# Patient Record
Sex: Female | Born: 1940 | Race: Black or African American | Hispanic: No | Smoking: Never smoker
Health system: Southern US, Community
[De-identification: ages and names within clinical notes are randomized; demographics above are authoritative.]

## PROBLEM LIST (undated history)

## (undated) DIAGNOSIS — C50919 Malignant neoplasm of unspecified site of unspecified female breast: Secondary | ICD-10-CM

## (undated) DIAGNOSIS — I10 Essential (primary) hypertension: Secondary | ICD-10-CM

## (undated) HISTORY — PX: TOTAL ABDOMINAL HYSTERECTOMY: SHX209

## (undated) HISTORY — PX: MASTECTOMY: SHX3

## (undated) HISTORY — PX: APPENDECTOMY: SHX54

---

## 2010-01-20 DIAGNOSIS — C50919 Malignant neoplasm of unspecified site of unspecified female breast: Secondary | ICD-10-CM | POA: Insufficient documentation

## 2011-01-31 DIAGNOSIS — E782 Mixed hyperlipidemia: Secondary | ICD-10-CM | POA: Insufficient documentation

## 2014-03-31 DIAGNOSIS — I10 Essential (primary) hypertension: Secondary | ICD-10-CM | POA: Insufficient documentation

## 2017-11-12 ENCOUNTER — Encounter: Payer: Self-pay | Admitting: Emergency Medicine

## 2017-11-12 ENCOUNTER — Emergency Department
Admission: EM | Admit: 2017-11-12 | Discharge: 2017-11-12 | Disposition: A | Discharge status: Home / Self Care | Payer: Medicare Other | Attending: Emergency Medicine | Admitting: Emergency Medicine

## 2017-11-12 ENCOUNTER — Emergency Department: Payer: Medicare Other

## 2017-11-12 DIAGNOSIS — Y9389 Activity, other specified: Secondary | ICD-10-CM | POA: Diagnosis not present

## 2017-11-12 DIAGNOSIS — Z853 Personal history of malignant neoplasm of breast: Secondary | ICD-10-CM | POA: Insufficient documentation

## 2017-11-12 DIAGNOSIS — W108XXA Fall (on) (from) other stairs and steps, initial encounter: Secondary | ICD-10-CM | POA: Insufficient documentation

## 2017-11-12 DIAGNOSIS — Y9222 Religious institution as the place of occurrence of the external cause: Secondary | ICD-10-CM | POA: Diagnosis not present

## 2017-11-12 DIAGNOSIS — Z23 Encounter for immunization: Secondary | ICD-10-CM | POA: Insufficient documentation

## 2017-11-12 DIAGNOSIS — Y999 Unspecified external cause status: Secondary | ICD-10-CM | POA: Diagnosis not present

## 2017-11-12 DIAGNOSIS — S098XXA Other specified injuries of head, initial encounter: Secondary | ICD-10-CM | POA: Diagnosis present

## 2017-11-12 DIAGNOSIS — S01112A Laceration without foreign body of left eyelid and periocular area, initial encounter: Secondary | ICD-10-CM | POA: Insufficient documentation

## 2017-11-12 DIAGNOSIS — W19XXXA Unspecified fall, initial encounter: Secondary | ICD-10-CM

## 2017-11-12 DIAGNOSIS — S0990XA Unspecified injury of head, initial encounter: Secondary | ICD-10-CM

## 2017-11-12 HISTORY — DX: Malignant neoplasm of unspecified site of unspecified female breast: C50.919

## 2017-11-12 HISTORY — DX: Essential (primary) hypertension: I10

## 2017-11-12 LAB — CBC WITH DIFFERENTIAL/PLATELET
BASOS PCT: 1 %
Basophils Absolute: 0 10*3/uL (ref 0–0.1)
Eosinophils Absolute: 0.1 10*3/uL (ref 0–0.7)
Eosinophils Relative: 2 %
HEMATOCRIT: 38.5 % (ref 35.0–47.0)
Hemoglobin: 13.3 g/dL (ref 12.0–16.0)
LYMPHS ABS: 2.1 10*3/uL (ref 1.0–3.6)
LYMPHS PCT: 35 %
MCH: 32.9 pg (ref 26.0–34.0)
MCHC: 34.4 g/dL (ref 32.0–36.0)
MCV: 95.5 fL (ref 80.0–100.0)
MONO ABS: 0.5 10*3/uL (ref 0.2–0.9)
Monocytes Relative: 8 %
NEUTROS ABS: 3.3 10*3/uL (ref 1.4–6.5)
Neutrophils Relative %: 54 %
Platelets: 194 10*3/uL (ref 150–440)
RBC: 4.04 MIL/uL (ref 3.80–5.20)
RDW: 13.1 % (ref 11.5–14.5)
WBC: 6.1 10*3/uL (ref 3.6–11.0)

## 2017-11-12 LAB — URINALYSIS, COMPLETE (UACMP) WITH MICROSCOPIC
BACTERIA UA: NONE SEEN
Bilirubin Urine: NEGATIVE
GLUCOSE, UA: NEGATIVE mg/dL
HGB URINE DIPSTICK: NEGATIVE
Ketones, ur: NEGATIVE mg/dL
Leukocytes, UA: NEGATIVE
NITRITE: NEGATIVE
Protein, ur: NEGATIVE mg/dL
SPECIFIC GRAVITY, URINE: 1.013 (ref 1.005–1.030)
pH: 6 (ref 5.0–8.0)

## 2017-11-12 LAB — BASIC METABOLIC PANEL
ANION GAP: 7 (ref 5–15)
BUN: 23 mg/dL — ABNORMAL HIGH (ref 6–20)
CALCIUM: 9.6 mg/dL (ref 8.9–10.3)
CO2: 26 mmol/L (ref 22–32)
Chloride: 106 mmol/L (ref 101–111)
Creatinine, Ser: 0.96 mg/dL (ref 0.44–1.00)
GFR calc non Af Amer: 56 mL/min — ABNORMAL LOW (ref >60)
Glucose, Bld: 108 mg/dL — ABNORMAL HIGH (ref 65–99)
Potassium: 4.4 mmol/L (ref 3.5–5.1)
Sodium: 139 mmol/L (ref 135–145)

## 2017-11-12 LAB — TROPONIN I

## 2017-11-12 MED ORDER — TETANUS-DIPHTH-ACELL PERTUSSIS 5-2.5-18.5 LF-MCG/0.5 IM SUSP
0.5000 mL | Freq: Once | INTRAMUSCULAR | Status: AC
  Administered 2017-11-12: 0.5 mL via INTRAMUSCULAR
  Filled 2017-11-12: qty 0.5

## 2017-11-12 MED ORDER — ACETAMINOPHEN 500 MG PO TABS
1000.0000 mg | ORAL_TABLET | Freq: Once | ORAL | Status: AC
  Administered 2017-11-12: 1000 mg via ORAL
  Filled 2017-11-12: qty 2

## 2017-11-12 NOTE — Discharge Instructions (Addendum)

## 2017-11-12 NOTE — ED Provider Notes (Signed)
[facility] Emergency Department Provider Note  ____________________________________________  Time seen: Approximately 12:42 PM  I have reviewed the triage vital signs and the nursing notes.   HISTORY  Chief Complaint Fall   HPI Tiffany Rios is a 77 y.o. female with a history of breast cancer in remission and high blood pressure who presents for evaluation of head trauma.  Patient reports that she was walking down the steps of her church and looking for a friend when she fell and hit her head on the steps.  According to a bystander they felt the patient looked like she was going to pass out and that is what led to her fall.  Patient does not think she passed out and denies feeling dizzy before this episode.  Patient did not loose consciousness. She did sustain a small laceration on her left eyebrow area.  Her last tetanus shot is unknown.  She is complaining of mild right-sided neck pain and left-sided headache which have been present since the fall, constant and nonradiating.  She denies back pain, extremity pain, chest pain, abdominal pain.  She reports being in her usual state of health today.  She reports that she is slightly sad because today is Mother's Day and she has been thinking a lot about her husband who died in 10/08/2015.  No fever or URI symptoms, no vomiting or diarrhea.  Patient reports she only had a juice this morning for breakfast.  She has not had lunch yet.  She did take her medications this morning.  Past Medical History:  Diagnosis Date  . Breast cancer ([place])   . Hypertension     Past Surgical History:  Procedure Laterality Date  . MASTECTOMY      Prior to Admission medications   Not on File    Allergies Patient has no allergy information on record.  FH Diabetes type II Brother    High blood pressure (Hypertension) Brother    Cancer Father  Small intestine  High blood pressure (Hypertension) Father    High blood pressure  (Hypertension) Mother    Stroke Mother    Breast cancer Sister    Diabetes type II Sister    High blood pressure (Hypertension) Sister       Social History Social History   Tobacco Use  . Smoking status: Never Smoker  . Smokeless tobacco: Never Used  Substance Use Topics  . Alcohol use: Never    Frequency: Never  . Drug use: Never    Review of Systems Constitutional: Negative for fever. Eyes: Negative for visual changes. ENT: Negative for facial injury. + neck pain Cardiovascular: Negative for chest injury. Respiratory: Negative for shortness of breath. Negative for chest wall injury. Gastrointestinal: Negative for abdominal pain or injury. Genitourinary: Negative for dysuria. Musculoskeletal: Negative for back injury, negative for arm or leg pain. Skin: + eyebrow laceration Neurological: + head injury.   ____________________________________________   PHYSICAL EXAM:  VITAL SIGNS: ED Triage Vitals  Enc Vitals Group     BP 11/12/17 1203 (!) 174/81     Pulse Rate 11/12/17 1203 (!) 56     Resp 11/12/17 1203 18     Temp 11/12/17 1206 98.9 F (37.2 C)     Temp Source 11/12/17 1203 Oral     SpO2 11/12/17 1203 98 %     Weight 11/12/17 1201 139 lb (63 kg)     Height 11/12/17 1201 5' (1.524 m)     Head Circumference --  Peak Flow --      Pain Score 11/12/17 1201 7     Pain Loc --      Pain Edu? --      Excl. in [place]? --     Full spinal precautions maintained throughout the trauma exam. Constitutional: Alert and oriented. No acute distress. Does not appear intoxicated. HEENT Head: Normocephalic and atraumatic. Face: No facial bony tenderness. Stable midface Ears: No hemotympanum bilaterally. No Battle sign Eyes: No eye injury. PERRL. No raccoon eyes Nose: Nontender. No epistaxis. No rhinorrhea Mouth/Throat: Mucous membranes are moist. No oropharyngeal blood. No dental injury. Airway patent without stridor. Normal voice. Neck: no C-collar in place. No  midline c-spine tenderness. R side neck tenderness with no obvious trauma Cardiovascular: Normal rate, regular rhythm. Normal and symmetric distal pulses are present in all extremities. Pulmonary/Chest: Chest wall is stable and nontender to palpation/compression. Normal respiratory effort. Breath sounds are normal. No crepitus.  Abdominal: Soft, nontender, non distended. Musculoskeletal: Nontender with normal full range of motion in all extremities. No deformities. No thoracic or lumbar midline spinal tenderness. Pelvis is stable. Skin: Skin is warm, dry and intact. There is a linear laceration over the L eyebrow and shallow abrasions to the L side of her face Psychiatric: Speech and behavior are appropriate. Neurological: Normal speech and language. Moves all extremities to command. No gross focal neurologic deficits are appreciated.  Glascow Coma Score: 4 - Opens eyes on own 6 - Follows simple motor commands 5 - Alert and oriented GCS: 15   ____________________________________________   LABS (all labs ordered are listed, but only abnormal results are displayed)  Labs Reviewed  BASIC METABOLIC PANEL - Abnormal; Notable for the following components:      Result Value   Glucose, Bld 108 (*)    BUN 23 (*)    GFR calc non Af Amer 56 (*)    All other components within normal limits  URINALYSIS, COMPLETE (UACMP) WITH MICROSCOPIC - Abnormal; Notable for the following components:   Color, Urine YELLOW (*)    APPearance CLEAR (*)    All other components within normal limits  CBC WITH DIFFERENTIAL/PLATELET  TROPONIN I   ____________________________________________  EKG  ED ECG REPORT I, Rudene Re, the attending physician, personally viewed and interpreted this ECG.  Sinus bradycardia, rate of 56, normal intervals, normal axis, no ST elevations or depressions, T wave inversion in V3 and V4. No prior for  comparison. ____________________________________________  RADIOLOGY  I have personally reviewed the images performed during this visit and I agree with the Radiologist's read.   Interpretation by Radiologist:  Ct Head Wo Contrast  Result Date: 11/12/2017 CLINICAL DATA:  Status post fall at church with head pain. EXAM: CT HEAD WITHOUT CONTRAST CT CERVICAL SPINE WITHOUT CONTRAST TECHNIQUE: Multidetector CT imaging of the head and cervical spine was performed following the standard protocol without intravenous contrast. Multiplanar CT image reconstructions of the cervical spine were also generated. COMPARISON:  None. FINDINGS: CT HEAD FINDINGS Brain: No evidence of acute infarction, hemorrhage, hydrocephalus, extra-axial collection or mass lesion/mass effect. Vascular: No hyperdense vessel. Skull: Normal. Negative for fracture or focal lesion. Sinuses/Orbits: No acute finding. Other: Left frontal scalp swelling/hematoma is noted. CT CERVICAL SPINE FINDINGS Alignment: Normal. Skull base and vertebrae: No acute fracture. No primary bone lesion or focal pathologic process. Soft tissues and spinal canal: No prevertebral fluid or swelling. No visible canal hematoma. Disc levels: Degenerative joint changes with narrowed joint space and osteophyte formation are identified at  C4, C5 and C6. Upper chest: Negative. Other: None. IMPRESSION: No focal acute intracranial abnormality identified. Left frontal scalp swelling/hematoma. No acute fracture or dislocation of cervical spine. Degenerative joint changes of cervical spine. Electronically Signed   By: Abelardo Diesel M.D.   On: 11/12/2017 12:42   Ct Cervical Spine Wo Contrast  Result Date: 11/12/2017 CLINICAL DATA:  Status post fall at church with head pain. EXAM: CT HEAD WITHOUT CONTRAST CT CERVICAL SPINE WITHOUT CONTRAST TECHNIQUE: Multidetector CT imaging of the head and cervical spine was performed following the standard protocol without intravenous contrast.  Multiplanar CT image reconstructions of the cervical spine were also generated. COMPARISON:  None. FINDINGS: CT HEAD FINDINGS Brain: No evidence of acute infarction, hemorrhage, hydrocephalus, extra-axial collection or mass lesion/mass effect. Vascular: No hyperdense vessel. Skull: Normal. Negative for fracture or focal lesion. Sinuses/Orbits: No acute finding. Other: Left frontal scalp swelling/hematoma is noted. CT CERVICAL SPINE FINDINGS Alignment: Normal. Skull base and vertebrae: No acute fracture. No primary bone lesion or focal pathologic process. Soft tissues and spinal canal: No prevertebral fluid or swelling. No visible canal hematoma. Disc levels: Degenerative joint changes with narrowed joint space and osteophyte formation are identified at C4, C5 and C6. Upper chest: Negative. Other: None. IMPRESSION: No focal acute intracranial abnormality identified. Left frontal scalp swelling/hematoma. No acute fracture or dislocation of cervical spine. Degenerative joint changes of cervical spine. Electronically Signed   By: Abelardo Diesel M.D.   On: 11/12/2017 12:42     ____________________________________________   PROCEDURES  Procedure(s) performed: yes .Marland KitchenLaceration Repair Date/Time: 11/12/2017 2:07 PM Performed by: Rudene Re, MD Authorized by: Rudene Re, MD   Consent:    Consent obtained:  Verbal   Consent given by:  Patient   Risks discussed:  Infection, pain, retained foreign body, poor cosmetic result and poor wound healing Anesthesia (see MAR for exact dosages):    Anesthesia method:  None Laceration details:    Length (cm):  2 Repair type:    Repair type:  Simple Exploration:    Hemostasis achieved with:  Direct pressure   Wound exploration: entire depth of wound probed and visualized     Wound extent: no fascia violation noted     Contaminated: no   Treatment:    Area cleansed with:  Saline   Amount of cleaning:  Extensive   Irrigation solution:  Sterile  saline   Visualized foreign bodies/material removed: no   Skin repair:    Repair method:  Steri-Strips and tissue adhesive   Number of Steri-Strips:  4 Approximation:    Approximation:  Close Post-procedure details:    Dressing:  Sterile dressing   Patient tolerance of procedure:  Tolerated well, no immediate complications   Critical Care performed:  None ____________________________________________   INITIAL IMPRESSION / ASSESSMENT AND PLAN / ED COURSE   77 y.o. female with a history of breast cancer in remission and high blood pressure who presents for evaluation of head trauma from fall vs syncopal event.  Patient sustained a linear shallow laceration over her left eyebrow.  Tetanus shot was given.  CT head and cervical spine showing no acute findings.  Since there was concern of possible near syncopal event leading to the fall an EKG was done which shows no evidence of ischemia or dysrhythmias.  Labs are pending to rule out anemia, dehydration, electrolyte abnormalities, urinary tract infection.  Laceration will be repaired.  Patient will be monitored on telemetry for any signs of arrhythmia.    _________________________  2:26 PM on 11/12/2017 -----------------------------------------  Labs with no evidence of dehydration, anemia, electrolyte abnormalities.  UA with no evidence of urinary tract infection.  Patient was monitored on telemetry for 2.5 hours with no evidence of arrhythmias.  At this time she is stable for discharge home with close follow-up with primary care doctor.  She is going to be discharged to the care of her daughter.   As part of my medical decision making, I reviewed the following data within the [facility] notes reviewed and incorporated, Labs reviewed , EKG interpreted , Old chart reviewed, Radiograph reviewed , Notes from prior ED visits and [place] Controlled Substance Database    Pertinent labs & imaging results that were available  during my care of the patient were reviewed by me and considered in my medical decision making (see chart for details).    ____________________________________________   FINAL CLINICAL IMPRESSION(S) / ED DIAGNOSES  Final diagnoses:  Fall, initial encounter  Injury of head, initial encounter  Eyebrow laceration, left, initial encounter      NEW MEDICATIONS STARTED DURING THIS VISIT:  ED Discharge Orders    None       Note:  This document was prepared using Dragon voice recognition software and may include unintentional dictation errors.    Alfred Levins, [place], MD 11/12/17 [phone]

## 2017-11-12 NOTE — ED Triage Notes (Signed)
Pt arrived with member from church after fall that caused her to hit her left head. Pt is unsure of the events leading up to fall and per church member "it looks like she passed out, not tripped." Upon arrival to ed pt alert and oriented x4 but was unable to stand, pt had to be carried from car and placed into bed. Pt does report pain to her head and states it is a 7 on a 0-10 scale and states it feels "sore."

## 2017-11-12 NOTE — ED Notes (Signed)
Ice pack placed on patient's left eye d/t swelling.

## 2018-05-18 DIAGNOSIS — I2510 Atherosclerotic heart disease of native coronary artery without angina pectoris: Secondary | ICD-10-CM | POA: Insufficient documentation

## 2018-05-18 DIAGNOSIS — I519 Heart disease, unspecified: Secondary | ICD-10-CM | POA: Insufficient documentation

## 2019-04-27 IMAGING — CT CT CERVICAL SPINE W/O CM
4 of 7 series · 13 of 33 positions shown, 14 images · non-contrast
Comparison: None.

CLINICAL DATA: Status post fall at church with head pain.

EXAM:
CT HEAD WITHOUT CONTRAST
CT CERVICAL SPINE WITHOUT CONTRAST
TECHNIQUE: Multidetector CT imaging of the head and cervical spine was
performed following the standard protocol without intravenous
contrast. Multiplanar CT image reconstructions of the cervical spine
were also generated.

[Series 7: c spine soft · axial · 0.29mm/px · z∈[+143,+235]mm · 4 of 78 slices shown]
[im 16/78  soft-tissue]
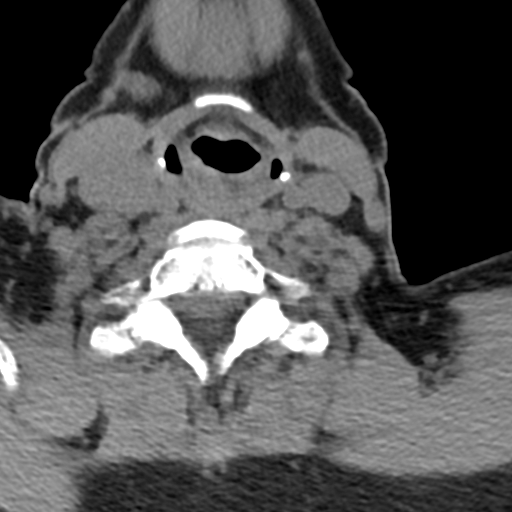
[im 31/78  soft-tissue]
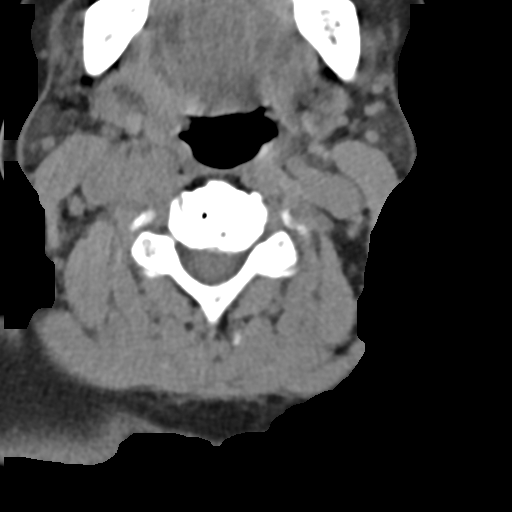
[im 47/78  soft-tissue]
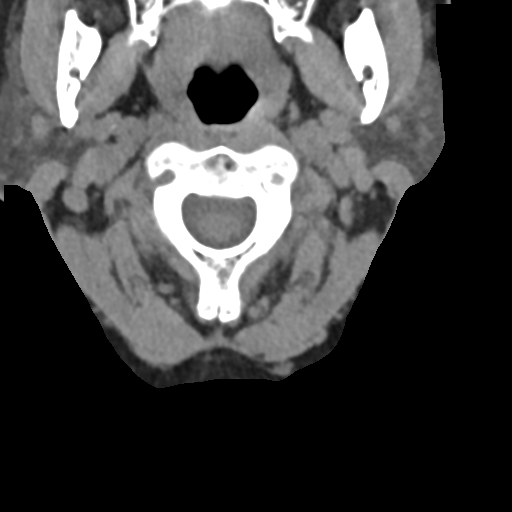
[im 62/78  soft-tissue]
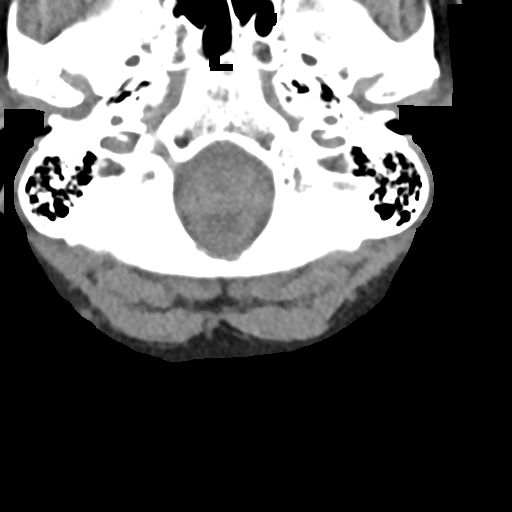

[Series 8: sagittal bone · sagittal · 0.23mm/px · 4 of 46 slices shown]
[im 10/46  bone]
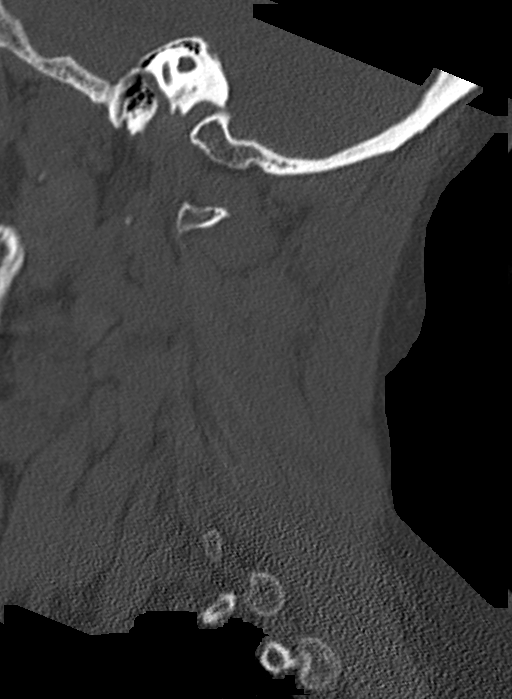
[im 19/46  bone]
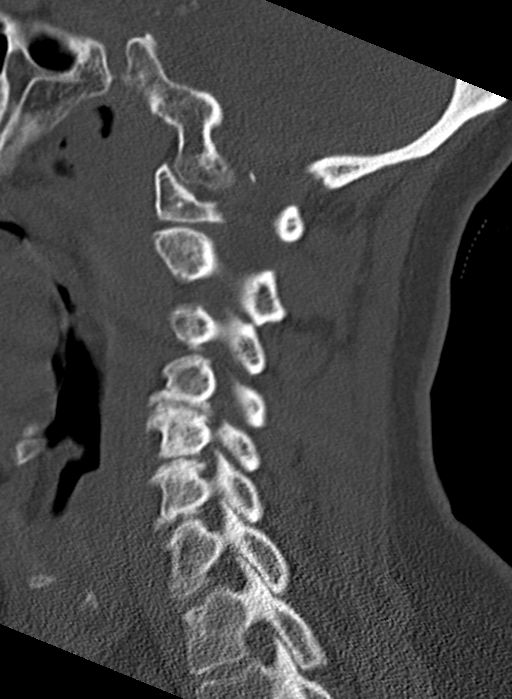
[im 28/46  bone]
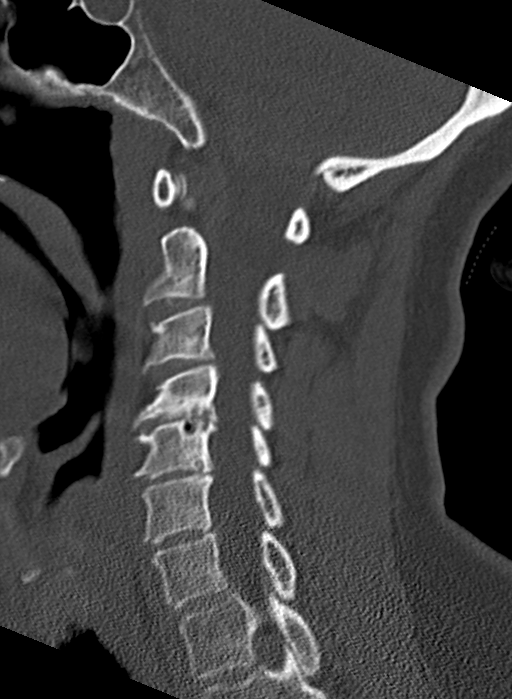
[im 37/46  bone]
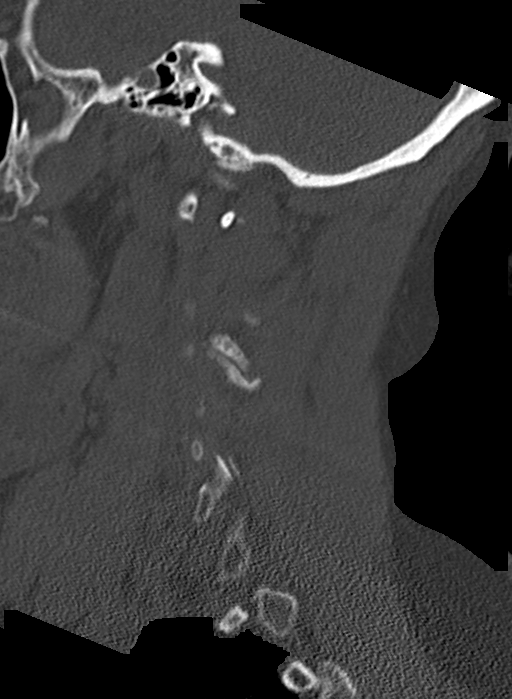

[Series 9: coronal bone · coronal · 0.22mm/px · 1 of 43 slices shown]
[im 22/43  bone]
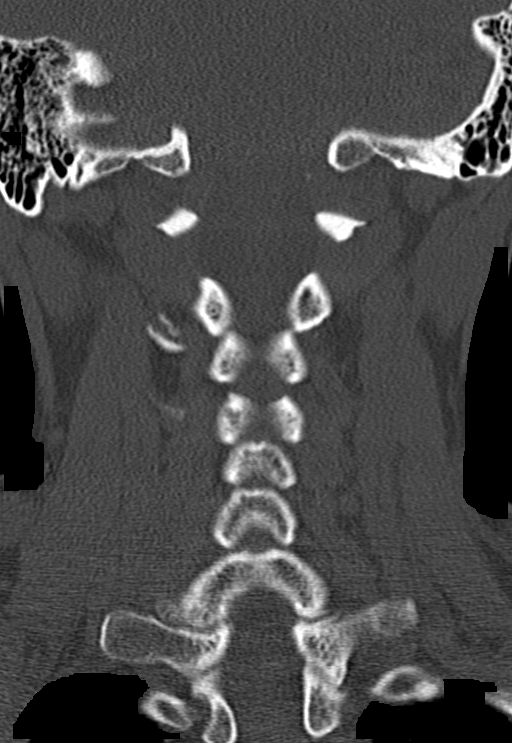

[Series 10: orthogonal bone · axial · 0.21mm/px · z∈[+119,+204]mm · 4 of 81 slices shown, 5 images]
[im 17/81  soft-tissue]
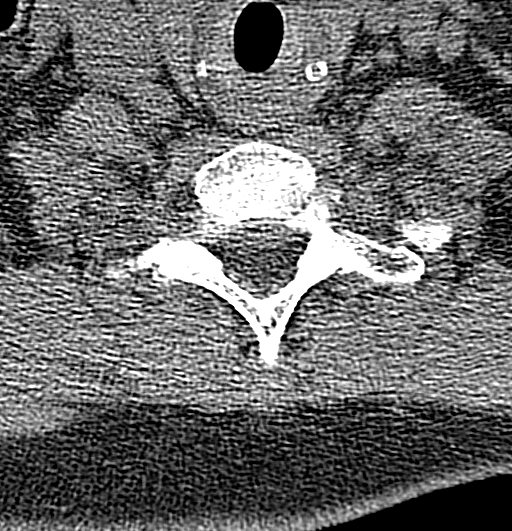
[im 17/81  bone]
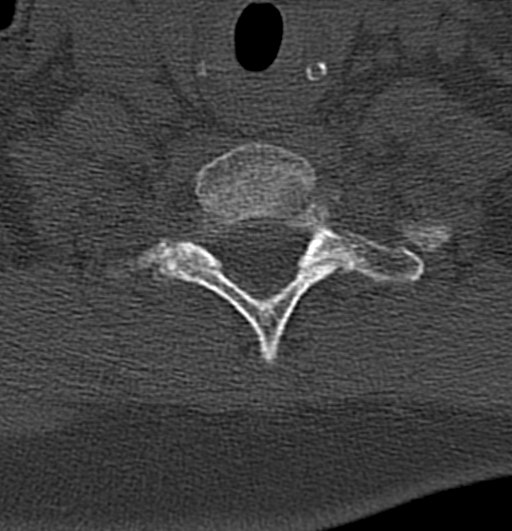
[im 33/81  bone]
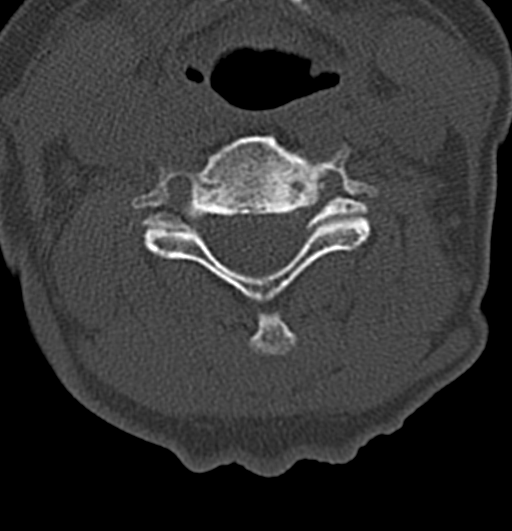
[im 49/81  bone]
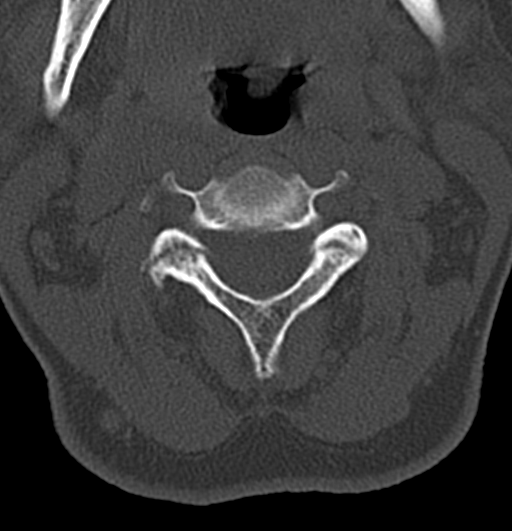
[im 65/81  bone]
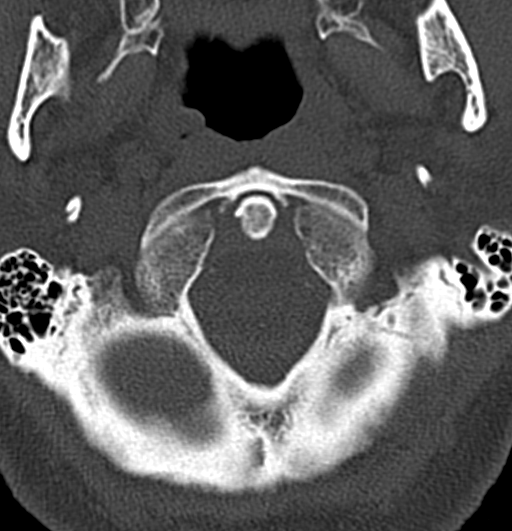

[13 of 33 positions shown; findings below may reference images not displayed]

FINDINGS: CT HEAD FINDINGS

Brain: No evidence of acute infarction, hemorrhage, hydrocephalus,
extra-axial collection or mass lesion/mass effect.

Vascular: No hyperdense vessel.

Skull: Normal. Negative for fracture or focal lesion.

Sinuses/Orbits: No acute finding.

Other: Left frontal scalp swelling/hematoma is noted.

CT CERVICAL SPINE FINDINGS

Alignment: Normal.

Skull base and vertebrae: No acute fracture. No primary bone lesion
or focal pathologic process.

Soft tissues and spinal canal: No prevertebral fluid or swelling. No
visible canal hematoma.

Disc levels: Degenerative joint changes with narrowed joint space
and osteophyte formation are identified at C4, C5 and C6.

Upper chest: Negative.

Other: None.
IMPRESSION: No focal acute intracranial abnormality identified.

Left frontal scalp swelling/hematoma.

No acute fracture or dislocation of cervical spine.

Degenerative joint changes of cervical spine.

## 2021-08-30 ENCOUNTER — Ambulatory Visit (INDEPENDENT_AMBULATORY_CARE_PROVIDER_SITE_OTHER): Payer: Medicare PPO | Admitting: Internal Medicine

## 2021-08-30 VITALS — BP 137/44 | HR 67 | Resp 16 | Ht 60.0 in | Wt 135.0 lb

## 2021-08-30 DIAGNOSIS — G4733 Obstructive sleep apnea (adult) (pediatric): Secondary | ICD-10-CM

## 2021-08-30 DIAGNOSIS — Z9989 Dependence on other enabling machines and devices: Secondary | ICD-10-CM | POA: Diagnosis not present

## 2021-08-30 DIAGNOSIS — Z7189 Other specified counseling: Secondary | ICD-10-CM | POA: Diagnosis not present

## 2021-08-30 NOTE — Progress Notes (Signed)
[facility] [place], [place] [address]  Pulmonary Sleep Medicine   Office Visit Note  Patient Name: Tiffany Rios DOB: [birth year] MRN [id number]    Chief Complaint: Obstructive Sleep Apnea visit  Brief History:  Tiffany Rios is seen today for initial consult to establish care.   The patient has a 2.5 year history of sleep apnea. Initial history and symptoms included hypertension, asthmas, chronic pain , hypertension, panic/anxiety attacks. Patient is using PAP nightly.  The patient feels good after sleeping with PAP, stating it takes approx 20 minutes to initially go to sleep.  Average bedtime  approx 11pm and wake up 8 am.   The patient reports thinking she no longer benefits and wants to be tested to come off. from PAP use. Epworth Sleepiness Score is 1 out of 24. The patient said she never take naps. The patient complains of the following: wants home sleep test  The compliance download shows  compliance with an average use time of 4;@ 93% hours. The AHI is 0.4  The patient does not complain of limb movements disrupting sleep.  ROS  General: (-) fever, (-) chills, (-) night sweat Nose and Sinuses: (-) nasal stuffiness or itchiness, (-) postnasal drip, (-) nosebleeds, (-) sinus trouble. Mouth and Throat: (-) sore throat, (-) hoarseness. Neck: (-) swollen glands, (-) enlarged thyroid, (-) neck pain. Respiratory: - cough, - shortness of breath, - wheezing. Neurologic: - numbness, - tingling. Psychiatric: - anxiety, - depression   Current Medication: Outpatient Encounter Medications as of 08/30/2021  Medication Sig   amLODipine-benazepril (LOTREL) 10-40 MG capsule Take by mouth.   ascorbic acid (VITAMIN C) 1000 MG tablet Take by mouth.   carvedilol (COREG) 6.25 MG tablet Take by mouth.   Cholecalciferol 50 MCG (2000 UT) TABS Take by mouth.   rosuvastatin (CRESTOR) 20 MG tablet Take by mouth.   No facility-administered encounter medications on file as of  08/30/2021.    Surgical History: Past Surgical History:  Procedure Laterality Date   APPENDECTOMY     MASTECTOMY     TOTAL ABDOMINAL HYSTERECTOMY      Medical History: Past Medical History:  Diagnosis Date   Breast cancer ([place])    Hypertension     Family History: Non contributory to the present illness  Social History: Social History   Socioeconomic History   Marital status: Widowed    Spouse name: Not on file   Number of children: Not on file   Years of education: Not on file   Highest education level: Not on file  Occupational History   Not on file  Tobacco Use   Smoking status: Never   Smokeless tobacco: Never  Substance and Sexual Activity   Alcohol use: Never   Drug use: Never   Sexual activity: Not on file  Other Topics Concern   Not on file  Social History Narrative   Not on file   Social Determinants of Health   Financial Resource Strain: Not on file  Food Insecurity: Not on file  Transportation Needs: Not on file  Physical Activity: Not on file  Stress: Not on file  Social Connections: Not on file  Intimate Partner Violence: Not on file    Vital Signs: Blood pressure (!) 137/44, pulse 67, resp. rate 16, height 5' (1.524 m), weight 135 lb (61.2 kg), SpO2 98 %. Body mass index is 26.37 kg/m.    Examination: General Appearance: The patient is well-developed, well-nourished, and in no distress. Neck Circumference: 33 cm  Skin: Gross inspection of skin unremarkable. Head: normocephalic, no gross deformities. Eyes: no gross deformities noted. ENT: ears appear grossly normal Neurologic: Alert and oriented. No involuntary movements.    EPWORTH SLEEPINESS SCALE:  Scale:  (0)= no chance of dozing; (1)= slight chance of dozing; (2)= moderate chance of dozing; (3)= high chance of dozing  Chance  Situtation    Sitting and reading: 0    Watching TV: 1    Sitting Inactive in public: 0    As a passenger in car: 0      Lying down to rest:  0    Sitting and talking: 0    Sitting quielty after lunch: 0    In a car, stopped in traffic: 0   TOTAL SCORE:   1 out of 24    SLEEP STUDIES:  PSG 04/07/2019 - Overall AHI 13,REM AHI 49,  PLMs 23/hr,  minimal SpO2 83%   CPAP COMPLIANCE DATA:  Date Range: 05/29/21 - 08/26/21  Average Daily Use: 4:58 hours  Median Use: 5:05 hours  Compliance for > 4 Hours: 93% days  AHI: 0.4 respiratory events per hour  Days Used: 88/90  Mask Leak: 34.6 lpm  95th Percentile Pressure: 9.7 cmH2O         LABS: No results found for this or any previous visit (from the past 2160 hour(s)).  Radiology: CT Head Wo Contrast  Result Date: 11/12/2017 CLINICAL DATA:  Status post fall at church with head pain. EXAM: CT HEAD WITHOUT CONTRAST CT CERVICAL SPINE WITHOUT CONTRAST TECHNIQUE: Multidetector CT imaging of the head and cervical spine was performed following the standard protocol without intravenous contrast. Multiplanar CT image reconstructions of the cervical spine were also generated. COMPARISON:  None. FINDINGS: CT HEAD FINDINGS Brain: No evidence of acute infarction, hemorrhage, hydrocephalus, extra-axial collection or mass lesion/mass effect. Vascular: No hyperdense vessel. Skull: Normal. Negative for fracture or focal lesion. Sinuses/Orbits: No acute finding. Other: Left frontal scalp swelling/hematoma is noted. CT CERVICAL SPINE FINDINGS Alignment: Normal. Skull base and vertebrae: No acute fracture. No primary bone lesion or focal pathologic process. Soft tissues and spinal canal: No prevertebral fluid or swelling. No visible canal hematoma. Disc levels: Degenerative joint changes with narrowed joint space and osteophyte formation are identified at C4, C5 and C6. Upper chest: Negative. Other: None. IMPRESSION: No focal acute intracranial abnormality identified. Left frontal scalp swelling/hematoma. No acute fracture or dislocation of cervical spine. Degenerative joint changes of  cervical spine. Electronically Signed   By: Abelardo Diesel M.D.   On: 11/12/2017 12:42   CT Cervical Spine Wo Contrast  Result Date: 11/12/2017 CLINICAL DATA:  Status post fall at church with head pain. EXAM: CT HEAD WITHOUT CONTRAST CT CERVICAL SPINE WITHOUT CONTRAST TECHNIQUE: Multidetector CT imaging of the head and cervical spine was performed following the standard protocol without intravenous contrast. Multiplanar CT image reconstructions of the cervical spine were also generated. COMPARISON:  None. FINDINGS: CT HEAD FINDINGS Brain: No evidence of acute infarction, hemorrhage, hydrocephalus, extra-axial collection or mass lesion/mass effect. Vascular: No hyperdense vessel. Skull: Normal. Negative for fracture or focal lesion. Sinuses/Orbits: No acute finding. Other: Left frontal scalp swelling/hematoma is noted. CT CERVICAL SPINE FINDINGS Alignment: Normal. Skull base and vertebrae: No acute fracture. No primary bone lesion or focal pathologic process. Soft tissues and spinal canal: No prevertebral fluid or swelling. No visible canal hematoma. Disc levels: Degenerative joint changes with narrowed joint space and osteophyte formation are identified at C4, C5 and C6. Upper chest: Negative.  Other: None. IMPRESSION: No focal acute intracranial abnormality identified. Left frontal scalp swelling/hematoma. No acute fracture or dislocation of cervical spine. Degenerative joint changes of cervical spine. Electronically Signed   By: Abelardo Diesel M.D.   On: 11/12/2017 12:42    No results found.  No results found.    Assessment and Plan: Patient Active Problem List   Diagnosis Date Noted   OSA on CPAP 08/30/2021   CPAP use counseling 08/30/2021   Coronary artery disease involving native coronary artery of native heart without angina pectoris 05/18/2018   LV dysfunction 05/18/2018   Essential hypertension 03/31/2014   Mixed hyperlipidemia 01/31/2011   Recurrent breast cancer ([place]) 01/20/2010   1.  OSA on CPAP The patient does tolerate PAP and reports  benefit from PAP use. The patient was reminded how to clean equipment and advised to replace supplies routinely. The patient was also counselled on weight loss. The compliance is very good. The AHI is 0.4.   OSA- continue with very good compliance with pap. Work on mask leak. F/u one year.   2. CPAP use counseling CPAP Counseling: had a lengthy discussion with the patient regarding the importance of PAP therapy in management of the sleep apnea. Patient appears to understand the risk factor reduction and also understands the risks associated with untreated sleep apnea. Patient will try to make a good faith effort to remain compliant with therapy. Also instructed the patient on proper cleaning of the device including the water must be changed daily if possible and use of distilled water is preferred. Patient understands that the machine should be regularly cleaned with appropriate recommended cleaning solutions that do not damage the PAP machine for example given white vinegar and water rinses. Other methods such as ozone treatment may not be as good as these simple methods to achieve cleaning.       General Counseling: I have discussed the findings of the evaluation and examination with [facility].  I have also discussed any further diagnostic evaluation thatmay be needed or ordered today. Tiffany Rios verbalizes understanding of the findings of todays visit. We also reviewed her medications today and discussed drug interactions and side effects including but not limited excessive drowsiness and altered mental states. We also discussed that there is always a risk not just to her but also people around her. she has been encouraged to call the office with any questions or concerns that should arise related to todays visit.  No orders of the defined types were placed in this encounter.       I have personally obtained a history, examined the patient,  evaluated laboratory and imaging results, formulated the assessment and plan and placed orders. This patient was seen today by Tressie Ellis, PA-C in collaboration with Dr. Devona Konig.   Allyne Gee, MD [facility] Diplomate ABMS Pulmonary Critical Care Medicine and Sleep Medicine

## 2021-08-30 NOTE — Patient Instructions (Signed)

## 2022-08-29 ENCOUNTER — Ambulatory Visit: Payer: Medicare PPO
